# Patient Record
Sex: Male | Born: 1990 | Race: White | Hispanic: No | Marital: Single | Smoking: Never smoker
Health system: Southern US, Community
[De-identification: ages and names within clinical notes are randomized; demographics above are authoritative.]

## PROBLEM LIST (undated history)

## (undated) DIAGNOSIS — R011 Cardiac murmur, unspecified: Secondary | ICD-10-CM

## (undated) DIAGNOSIS — M6282 Rhabdomyolysis: Secondary | ICD-10-CM

## (undated) HISTORY — PX: OTHER SURGICAL HISTORY: SHX169

---

## 2014-09-12 ENCOUNTER — Emergency Department (HOSPITAL_COMMUNITY): Payer: Worker's Compensation

## 2014-09-12 ENCOUNTER — Emergency Department (HOSPITAL_COMMUNITY)
Admission: EM | Admit: 2014-09-12 | Discharge: 2014-09-12 | Disposition: A | Discharge status: Home / Self Care | Payer: Worker's Compensation | Attending: Emergency Medicine | Admitting: Emergency Medicine

## 2014-09-12 ENCOUNTER — Encounter (HOSPITAL_COMMUNITY): Payer: Self-pay | Admitting: Emergency Medicine

## 2014-09-12 DIAGNOSIS — Y9389 Activity, other specified: Secondary | ICD-10-CM | POA: Insufficient documentation

## 2014-09-12 DIAGNOSIS — S80212A Abrasion, left knee, initial encounter: Secondary | ICD-10-CM | POA: Diagnosis not present

## 2014-09-12 DIAGNOSIS — S8392XA Sprain of unspecified site of left knee, initial encounter: Secondary | ICD-10-CM

## 2014-09-12 DIAGNOSIS — Y9289 Other specified places as the place of occurrence of the external cause: Secondary | ICD-10-CM | POA: Insufficient documentation

## 2014-09-12 DIAGNOSIS — W01198A Fall on same level from slipping, tripping and stumbling with subsequent striking against other object, initial encounter: Secondary | ICD-10-CM | POA: Diagnosis not present

## 2014-09-12 DIAGNOSIS — Y998 Other external cause status: Secondary | ICD-10-CM | POA: Diagnosis not present

## 2014-09-12 DIAGNOSIS — S8992XA Unspecified injury of left lower leg, initial encounter: Secondary | ICD-10-CM | POA: Diagnosis present

## 2014-09-12 MED ORDER — NAPROXEN 500 MG PO TABS: 500.0000 mg | ORAL_TABLET | Freq: Two times a day (BID) | ORAL | Status: DC
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
## 2014-09-12 NOTE — Discharge Instructions (Signed)
Knee Pain Knee pain can be a result of an injury or other medical conditions. Treatment will depend on the cause of your pain. HOME CARE  Only take medicine as told by your doctor.  Keep a healthy weight. Being overweight can make the knee hurt more.  Stretch before exercising or playing sports.  If there is constant knee pain, change the way you exercise. Ask your doctor for advice.  Make sure shoes fit well. Choose the right shoe for the sport or activity.  Protect your knees. Wear kneepads if needed.  Rest when you are tired. GET HELP RIGHT AWAY IF:   Your knee pain does not stop.  Your knee pain does not get better.  Your knee joint feels hot to the touch.  You have a fever. MAKE SURE YOU:   Understand these instructions.  Will watch this condition.  Will get help right away if you are not doing well or get worse. Document Released: 03/31/2008 Document Revised: 03/27/2011 Document Reviewed: 03/31/2008 [facility] Patient Information 2015 [place], [place]. This information is not intended to replace advice given to you by your health care provider. Make sure you discuss any questions you have with your health care provider.
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
## 2015-12-04 ENCOUNTER — Emergency Department (HOSPITAL_COMMUNITY)
Admission: EM | Admit: 2015-12-04 | Discharge: 2015-12-05 | Disposition: A | Discharge status: Home / Self Care | Payer: BLUE CROSS/BLUE SHIELD | Attending: Emergency Medicine | Admitting: Emergency Medicine

## 2015-12-04 ENCOUNTER — Encounter (HOSPITAL_COMMUNITY): Payer: Self-pay | Admitting: *Deleted

## 2015-12-04 DIAGNOSIS — L509 Urticaria, unspecified: Secondary | ICD-10-CM | POA: Diagnosis not present

## 2015-12-04 DIAGNOSIS — Z792 Long term (current) use of antibiotics: Secondary | ICD-10-CM | POA: Insufficient documentation

## 2015-12-04 DIAGNOSIS — R21 Rash and other nonspecific skin eruption: Secondary | ICD-10-CM | POA: Diagnosis present

## 2015-12-04 DIAGNOSIS — F17290 Nicotine dependence, other tobacco product, uncomplicated: Secondary | ICD-10-CM | POA: Insufficient documentation

## 2015-12-04 HISTORY — DX: Cardiac murmur, unspecified: R01.1

## 2015-12-04 NOTE — ED Provider Notes (Signed)
[facility] DEPT Provider Note   CSN: [id number] Arrival date & time: 12/04/15  2338   By signing my name below, I, Delton Prairie, attest that this documentation has been prepared under the direction and in the presence of Merryl Hacker, MD  Electronically Signed: Delton [place], ED Scribe. 12/04/15. 12:27 AM.   History   Chief Complaint Chief Complaint  Patient presents with  . Allergic Reaction    The history is provided by the patient. No language interpreter was used.   HPI Comments:  Derrick Wang is a 25 y.o. male who presents to the Emergency Department complaining of a sudden onset rash to his bilateral upper extremities with associated itching which began a couple of hours ago.Marland Kitchen He states he ate a new restuarqant tonight which he has never eaten at before. Pt denies any new changes in medications or changes is soaps/detergents. He states he is taking bactrim (chronically) and recently switched to a new pharmacy. Pt denies SOB, abdominal pain and any other associated symptoms or modifying factors at this time. He is allergic to pollen and poison oak but denies any exposure.    Past Medical History:  Diagnosis Date  . Heart murmur     There are no active problems to display for this patient.   Past Surgical History:  Procedure Laterality Date  . left arm surgery       Home Medications    Prior to Admission medications   Medication Sig Start Date End Date Taking? Authorizing Provider  sulfamethoxazole-trimethoprim (BACTRIM DS,SEPTRA DS) 800-160 MG tablet Take 1 tablet by mouth 2 (two) times daily.   Yes Historical Provider, MD  diphenhydrAMINE (BENADRYL) 25 mg capsule Take 1 capsule (25 mg total) by mouth every 6 (six) hours as needed. 12/05/15   Merryl Hacker, MD  EPINEPHrine 0.3 mg/0.3 mL IJ SOAJ injection Inject 0.3 mLs (0.3 mg total) into the muscle once as needed (anaphylaxis). 12/05/15   Merryl Hacker, MD  famotidine (PEPCID) 20 MG tablet Take 1  tablet (20 mg total) by mouth daily. 12/05/15   Merryl Hacker, MD  naproxen (NAPROSYN) 500 MG tablet Take 1 tablet (500 mg total) by mouth 2 (two) times daily with a meal. 09/12/14   Tammy Triplett, PA-C  predniSONE (DELTASONE) 20 MG tablet Take 2 tablets (40 mg total) by mouth daily. 12/05/15   Merryl Hacker, MD    Family History No family history on file.  Social History Social History  Substance Use Topics  . Smoking status: Never Smoker  . Smokeless tobacco: Current User    Types: Snuff  . Alcohol use 1.2 oz/week    2 Cans of beer per week     Allergies   Patient has no known allergies.   Review of Systems Review of Systems  Constitutional: Negative for fever.  Respiratory: Negative for shortness of breath.   Gastrointestinal: Negative for abdominal pain, nausea and vomiting.  Skin: Positive for rash.  Neurological: Negative for dizziness.  All other systems reviewed and are negative.    Physical Exam Updated Vital Signs BP 135/91   Pulse 69   Temp 97.9 F (36.6 C) (Oral)   Resp 20   Ht 5\' 8"  (1.727 m)   Wt 210 lb (95.3 kg)   SpO2 96%   BMI 31.93 kg/m   Physical Exam  Constitutional: He is oriented to person, place, and time. He appears well-developed and well-nourished. No distress.  HENT:  Head: Normocephalic and atraumatic.  Mouth/Throat:  Oropharynx is clear and moist.  No swelling, uvula midline  Cardiovascular: Normal rate, regular rhythm and normal heart sounds.   No murmur heard. Pulmonary/Chest: Effort normal and breath sounds normal. No respiratory distress. He has no wheezes.  Abdominal: Soft. There is no tenderness.  Musculoskeletal: He exhibits no edema.  Neurological: He is alert and oriented to person, place, and time.  Skin: Skin is warm and dry.  Psychiatric: He has a normal mood and affect.  Hives noted mostly over the bilateral upper extremities  Nursing note and vitals reviewed.    ED Treatments / Results  DIAGNOSTIC  STUDIES:  Oxygen Saturation is 100% on RA, normal by my interpretation.    COORDINATION OF CARE:  12:18 AM Discussed treatment plan with pt at bedside and pt agreed to plan.  Labs (all labs ordered are listed, but only abnormal results are displayed) Labs Reviewed - No data to display  EKG  EKG Interpretation None       Radiology No results found.  Procedures Procedures (including critical care time)  Medications Ordered in ED Medications  diphenhydrAMINE (BENADRYL) injection 25 mg (25 mg Intravenous Given 12/05/15 0058)  famotidine (PEPCID) IVPB 20 mg premix (20 mg Intravenous New Bag/Given 12/05/15 0059)  methylPREDNISolone sodium succinate (SOLU-MEDROL) 125 mg/2 mL injection 125 mg (125 mg Intravenous Given 12/05/15 0100)     Initial Impression / Assessment and Plan / ED Course  I have reviewed the triage vital signs and the nursing notes.  Pertinent labs & imaging results that were available during my care of the patient were reviewed by me and considered in my medical decision making (see chart for details).  Clinical Course     Patient presents with hives bilateral upper extremities. No signs or symptoms of anaphylaxis. Unknown antigen. Patient is on chronic Bactrim. Patient given steroids, Benadryl, Pepcid.  1:41 AM Patient resting comfortably. Hives resolve. Discussed with patient continuing steroids, Pepcid, and Benadryl when necessary. Will sent home with an EpiPen as well.  After history, exam, and medical workup I feel the patient has been appropriately medically screened and is safe for discharge home. Pertinent diagnoses were discussed with the patient. Patient was given return precautions.   Final Clinical Impressions(s) / ED Diagnoses   Final diagnoses:  Hives    New Prescriptions New Prescriptions   DIPHENHYDRAMINE (BENADRYL) 25 MG CAPSULE    Take 1 capsule (25 mg total) by mouth every 6 (six) hours as needed.   EPINEPHRINE 0.3 MG/0.3 ML IJ  SOAJ INJECTION    Inject 0.3 mLs (0.3 mg total) into the muscle once as needed (anaphylaxis).   FAMOTIDINE (PEPCID) 20 MG TABLET    Take 1 tablet (20 mg total) by mouth daily.   PREDNISONE (DELTASONE) 20 MG TABLET    Take 2 tablets (40 mg total) by mouth daily.   I personally performed the services described in this documentation, which was scribed in my presence. The recorded information has been reviewed and is accurate.     Merryl Hacker, MD 12/05/15 [phone]

## 2015-12-04 NOTE — ED Triage Notes (Addendum)
Pt c/o sob, hives, itching that started about a hour ago, pt states the he takes bactrim but has been on it for "awhile" did have to switch to a new pharmacy a few days ago,

## 2015-12-05 DIAGNOSIS — L509 Urticaria, unspecified: Secondary | ICD-10-CM | POA: Diagnosis not present

## 2015-12-05 MED ORDER — DIPHENHYDRAMINE HCL 50 MG/ML IJ SOLN
25.0000 mg | Freq: Once | INTRAMUSCULAR | Status: AC
  Administered 2015-12-05: 25 mg via INTRAVENOUS

## 2015-12-05 MED ORDER — EPINEPHRINE 0.3 MG/0.3ML IJ SOAJ: 0.3000 mg | Freq: Once | INTRAMUSCULAR | 0 refills | Status: DC | PRN

## 2015-12-05 MED ORDER — METHYLPREDNISOLONE SODIUM SUCC 125 MG IJ SOLR
125.0000 mg | Freq: Once | INTRAMUSCULAR | Status: AC
  Administered 2015-12-05: 125 mg via INTRAVENOUS

## 2015-12-05 MED ORDER — METHYLPREDNISOLONE SODIUM SUCC 125 MG IJ SOLR
INTRAMUSCULAR | Status: AC
  Filled 2015-12-05: qty 2

## 2015-12-05 MED ORDER — DIPHENHYDRAMINE HCL 50 MG/ML IJ SOLN
INTRAMUSCULAR | Status: AC
  Filled 2015-12-05: qty 1

## 2015-12-05 MED ORDER — FAMOTIDINE IN NACL 20-0.9 MG/50ML-% IV SOLN
20.0000 mg | Freq: Once | INTRAVENOUS | Status: AC
  Administered 2015-12-05: 20 mg via INTRAVENOUS

## 2015-12-05 MED ORDER — DIPHENHYDRAMINE HCL 25 MG PO CAPS: 25.0000 mg | ORAL_CAPSULE | Freq: Four times a day (QID) | ORAL | 0 refills | Status: DC | PRN

## 2015-12-05 MED ORDER — PREDNISONE 20 MG PO TABS: 40.0000 mg | ORAL_TABLET | Freq: Every day | ORAL | 0 refills | Status: DC

## 2015-12-05 MED ORDER — FAMOTIDINE IN NACL 20-0.9 MG/50ML-% IV SOLN
INTRAVENOUS | Status: AC
  Filled 2015-12-05: qty 50

## 2015-12-05 MED ORDER — FAMOTIDINE 20 MG PO TABS: 20.0000 mg | ORAL_TABLET | Freq: Every day | ORAL | 0 refills | Status: DC

## 2019-09-10 ENCOUNTER — Other Ambulatory Visit: Payer: Self-pay

## 2019-09-10 ENCOUNTER — Emergency Department (HOSPITAL_COMMUNITY): Payer: BC Managed Care – PPO

## 2019-09-10 ENCOUNTER — Emergency Department (HOSPITAL_COMMUNITY)
Admission: EM | Admit: 2019-09-10 | Discharge: 2019-09-10 | Disposition: A | Discharge status: Home / Self Care | Payer: BC Managed Care – PPO | Attending: Emergency Medicine | Admitting: Emergency Medicine

## 2019-09-10 ENCOUNTER — Encounter (HOSPITAL_COMMUNITY): Payer: Self-pay | Admitting: *Deleted

## 2019-09-10 DIAGNOSIS — R1032 Left lower quadrant pain: Secondary | ICD-10-CM | POA: Diagnosis present

## 2019-09-10 DIAGNOSIS — R197 Diarrhea, unspecified: Secondary | ICD-10-CM

## 2019-09-10 DIAGNOSIS — K529 Noninfective gastroenteritis and colitis, unspecified: Secondary | ICD-10-CM | POA: Insufficient documentation

## 2019-09-10 DIAGNOSIS — K921 Melena: Secondary | ICD-10-CM | POA: Insufficient documentation

## 2019-09-10 DIAGNOSIS — Z79899 Other long term (current) drug therapy: Secondary | ICD-10-CM | POA: Diagnosis not present

## 2019-09-10 HISTORY — DX: Rhabdomyolysis: M62.82

## 2019-09-10 LAB — CBC
HCT: 46.9 % (ref 39.0–52.0)
Hemoglobin: 15.6 g/dL (ref 13.0–17.0)
MCH: 29.9 pg (ref 26.0–34.0)
MCHC: 33.3 g/dL (ref 30.0–36.0)
MCV: 90 fL (ref 80.0–100.0)
Platelets: 304 10*3/uL (ref 150–400)
RBC: 5.21 MIL/uL (ref 4.22–5.81)
RDW: 12.3 % (ref 11.5–15.5)
WBC: 8.3 10*3/uL (ref 4.0–10.5)
nRBC: 0 % (ref 0.0–0.2)

## 2019-09-10 LAB — POC OCCULT BLOOD, ED: Fecal Occult Bld: NEGATIVE

## 2019-09-10 LAB — COMPREHENSIVE METABOLIC PANEL
ALT: 25 U/L (ref 0–44)
AST: 25 U/L (ref 15–41)
Albumin: 4.4 g/dL (ref 3.5–5.0)
Alkaline Phosphatase: 104 U/L (ref 38–126)
Anion gap: 10 (ref 5–15)
BUN: 15 mg/dL (ref 6–20)
CO2: 23 mmol/L (ref 22–32)
Calcium: 8.9 mg/dL (ref 8.9–10.3)
Chloride: 103 mmol/L (ref 98–111)
Creatinine, Ser: 1.11 mg/dL (ref 0.61–1.24)
GFR calc Af Amer: >60 mL/min (ref >60)
GFR calc non Af Amer: >60 mL/min (ref >60)
Glucose, Bld: 89 mg/dL (ref 70–99)
Potassium: 4 mmol/L (ref 3.5–5.1)
Sodium: 136 mmol/L (ref 135–145)
Total Bilirubin: 0.6 mg/dL (ref 0.3–1.2)
Total Protein: 8.3 g/dL — ABNORMAL HIGH (ref 6.5–8.1)

## 2019-09-10 LAB — TYPE AND SCREEN
ABO/RH(D): O POS
Antibody Screen: NEGATIVE

## 2019-09-10 MED ORDER — AZITHROMYCIN 500 MG PO TABS
500.0000 mg | ORAL_TABLET | Freq: Every day | ORAL | 0 refills | Status: AC
Start: 2019-09-10 — End: 2019-09-15

## 2019-09-10 MED ORDER — IOHEXOL 300 MG/ML  SOLN
100.0000 mL | Freq: Once | INTRAMUSCULAR | Status: AC | PRN
  Administered 2019-09-10: 100 mL via INTRAVENOUS

## 2019-09-10 NOTE — ED Triage Notes (Signed)
Pt states he started yesterday with abdominal pain and some diarrhea and it has progressively went from dark stools, coffee ground consistency stools to bright red blood

## 2019-09-10 NOTE — ED Notes (Signed)
Pt has commode at bedside.

## 2019-09-10 NOTE — Discharge Instructions (Addendum)
Your work-up today was reassuring, I want you to stay hydrated.  Use the attached instructions.  Stick to a brat diet for little while, this includes bananas, rice, applesauce and toast.  Want you to also take antibiotics as directed.  I want you to follow-up with your primary care in the next couple of days.  In regards to antibiotics, they do have a lot of side effects including diarrhea as we spoke about.  Please speak to your pharmacist about all the side effects.

## 2019-09-10 NOTE — ED Provider Notes (Signed)
[facility] Provider Note   CSN: [id number] Arrival date & time: 09/10/19  1013     History Chief Complaint  Patient presents with  . Rectal Bleeding    Derrick Wang is a 29 y.o. male with reported past medical history represents the emergency department today for abdominal pain and diarrhea.  Patient states that abdominal pain is more in his left lower quadrant, started a couple days ago and noticed that he started having some dark brown to black stool for the past 2 days, this morning he woke up and noticed that he had bright red blood per rectum in the toilet bowl.  States that he only had one episode of this, after he went to the bathroom again this did not happen again.  States that he is about about 16 episodes of diarrhea since yesterday.  Abdominal pain has persisted, is intermittent.  States that it is a dull aching pain.  Denies any recent antibiotics.  Denies any recent travel.  Denies any recent hiking or drinking water from streams.  Has never had anything happen to him like this before.  Denies any fevers, chills, nausea, vomiting.  Denies any rashes, headache, myalgias.  Has an appetite.  No abdominal surgeries.  States that he went out drinking 2 nights ago, no drug use.  No rectal pain, no pain when he uses the restroom.  No history of hemorrhoids.  HPI     Past Medical History:  Diagnosis Date  . Heart murmur   . Rhabdomyolysis     There are no problems to display for this patient.   Past Surgical History:  Procedure Laterality Date  . left arm surgery         History reviewed. No pertinent family history.  Social History   Tobacco Use  . Smoking status: Never Smoker  . Smokeless tobacco: Current User    Types: Snuff  Substance Use Topics  . Alcohol use: Yes    Alcohol/week: 2.0 standard drinks    Types: 2 Cans of beer per week  . Drug use: No    Home Medications Prior to Admission medications   Medication Sig Start Date End  Date Taking? Authorizing Provider  azithromycin (ZITHROMAX) 500 MG tablet Take 1 tablet (500 mg total) by mouth daily for 5 days. Take 1 tablet a day for 5 days. 09/10/19 09/15/19  Alfredia Client, PA-C  diphenhydrAMINE (BENADRYL) 25 mg capsule Take 1 capsule (25 mg total) by mouth every 6 (six) hours as needed. 12/05/15   Horton, Barbette Hair, MD  EPINEPHrine 0.3 mg/0.3 mL IJ SOAJ injection Inject 0.3 mLs (0.3 mg total) into the muscle once as needed (anaphylaxis). 12/05/15   Horton, Barbette Hair, MD  famotidine (PEPCID) 20 MG tablet Take 1 tablet (20 mg total) by mouth daily. 12/05/15   Horton, Barbette Hair, MD  naproxen (NAPROSYN) 500 MG tablet Take 1 tablet (500 mg total) by mouth 2 (two) times daily with a meal. 09/12/14   Triplett, Tammy, PA-C  predniSONE (DELTASONE) 20 MG tablet Take 2 tablets (40 mg total) by mouth daily. 12/05/15   Horton, Barbette Hair, MD  sulfamethoxazole-trimethoprim (BACTRIM DS,SEPTRA DS) 800-160 MG tablet Take 1 tablet by mouth 2 (two) times daily.    [provider]    Allergies    Patient has no known allergies.  Review of Systems   Review of Systems  Constitutional: Negative for chills, diaphoresis, fatigue and fever.  HENT: Negative for congestion, sore throat and trouble  swallowing.   Eyes: Negative for pain and visual disturbance.  Respiratory: Negative for cough, shortness of breath and wheezing.   Cardiovascular: Negative for chest pain, palpitations and leg swelling.  Gastrointestinal: Positive for abdominal pain, blood in stool and diarrhea. Negative for abdominal distention, nausea and vomiting.  Genitourinary: Negative for difficulty urinating.  Musculoskeletal: Negative for back pain, neck pain and neck stiffness.  Skin: Negative for pallor.  Neurological: Negative for dizziness, speech difficulty, weakness and headaches.  Psychiatric/Behavioral: Negative for confusion.    Physical Exam Updated Vital Signs BP (!) 133/94   Pulse 72   Temp 98.2 F  (36.8 C) (Oral)   Resp (!) 22   Ht 5\' 8"  (1.727 m)   Wt 111.6 kg   SpO2 100%   BMI 37.40 kg/m   Physical Exam Exam conducted with a chaperone present.  Constitutional:      General: He is not in acute distress.    Appearance: Normal appearance. He is not ill-appearing, toxic-appearing or diaphoretic.  HENT:     Head: Normocephalic and atraumatic.     Mouth/Throat:     Mouth: Mucous membranes are moist.     Pharynx: Oropharynx is clear.  Eyes:     General: No scleral icterus.    Extraocular Movements: Extraocular movements intact.     Pupils: Pupils are equal, round, and reactive to light.  Cardiovascular:     Rate and Rhythm: Normal rate and regular rhythm.     Pulses: Normal pulses.     Heart sounds: Normal heart sounds.  Pulmonary:     Effort: Pulmonary effort is normal. No respiratory distress.     Breath sounds: Normal breath sounds. No stridor. No wheezing, rhonchi or rales.  Chest:     Chest wall: No tenderness.  Abdominal:     General: Abdomen is flat. Bowel sounds are normal. There is no distension.     Palpations: Abdomen is soft.     Tenderness: There is abdominal tenderness in the left lower quadrant. There is no guarding or rebound. Negative signs include Murphy's sign, Rovsing's sign, McBurney's sign and psoas sign.  Genitourinary:    Comments: Chaperone present. Digital Rectal exam reveals sphincter with good tone. No external hemorrhoids, masses, or fissures. Stool color is brown with no overt blood. No gross melena.   Musculoskeletal:        General: No swelling or tenderness. Normal range of motion.     Cervical back: Normal range of motion and neck supple. No rigidity.     Right lower leg: No edema.     Left lower leg: No edema.  Skin:    General: Skin is warm and dry.     Capillary Refill: Capillary refill takes less than 2 seconds.     Coloration: Skin is not pale.  Neurological:     General: No focal deficit present.     Mental Status: He is  alert and oriented to person, place, and time.  Psychiatric:        Mood and Affect: Mood normal.        Behavior: Behavior normal.     ED Results / Procedures / Treatments   Labs (all labs ordered are listed, but only abnormal results are displayed) Labs Reviewed  COMPREHENSIVE METABOLIC PANEL - Abnormal; Notable for the following components:      Result Value   Total Protein 8.3 (*)    All other components within normal limits  GASTROINTESTINAL PANEL BY PCR, STOOL (REPLACES  STOOL CULTURE)  C DIFFICILE QUICK SCREEN W PCR REFLEX  CBC  POC OCCULT BLOOD, ED  TYPE AND SCREEN    EKG None  Radiology CT Abdomen Pelvis W Contrast  Result Date: 09/10/2019 CLINICAL DATA:  LEFT lower quadrant abdominal pain EXAM: CT ABDOMEN AND PELVIS WITH CONTRAST TECHNIQUE: Multidetector CT imaging of the abdomen and pelvis was performed using the standard protocol following bolus administration of intravenous contrast. CONTRAST:  14mL OMNIPAQUE IOHEXOL 300 MG/ML  SOLN COMPARISON:  None FINDINGS: Lower chest: Lung bases are clear. Hepatobiliary: No focal, suspicious hepatic lesion. No pericholecystic stranding. No biliary duct distension. Portal vein is patent. Pancreas: Pancreas is normal. Spleen: Spleen normal in size and contour without focal lesion. Adrenals/Urinary Tract: Adrenal glands are normal. No hydronephrosis.  No suspicious renal lesion. Stomach/Bowel: Stomach under distended.  No small bowel dilation. Appendix is normal. Mid transverse colon with thickening. Generalized increased vascularity about the colon. Ascending colon to a lesser extent with some thickening. Appendix is normal. Mild prominence of ileocolic lymph nodes. Vascular/Lymphatic: No atheromatous plaque of the abdominal aorta. No aneurysmal dilation. SMV is patent. No adenopathy in the retroperitoneum. No pelvic lymphadenopathy. Reproductive: Prostate unremarkable by CT. Other: Small fat containing umbilical hernia. Musculoskeletal:  No acute musculoskeletal process. No destructive bone finding. IMPRESSION: 1. Generalized increased vascularity about the colon. Findings suggest generalized mild colitis perhaps worse in the ascending and proximal transverse colon. 2. Mild prominence of ileocolic lymph nodes, likely reactive. 3. Small fat containing umbilical hernia. 4. Aortic atherosclerosis. Aortic Atherosclerosis (ICD10-I70.0). Electronically Signed   By: Zetta Bills M.D.   On: 09/10/2019 17:19    Procedures Procedures (including critical care time)  Medications Ordered in ED Medications  iohexol (OMNIPAQUE) 300 MG/ML solution 100 mL (100 mLs Intravenous Contrast Given 09/10/19 1632)    ED Course  I have reviewed the triage vital signs and the nursing notes.  Pertinent labs & imaging results that were available during my care of the patient were reviewed by me and considered in my medical decision making (see chart for details).    MDM Rules/Calculators/A&P                          Adreyan Carbajal is a 29 y.o. male with reported past medical history represents the emergency department today for abdominal pain and diarrhea.  Patient with left lower quadrant pain with diarrhea and blood in stool, concern for diverticulitis at this time will obtain CT scan.  We will also obtain stool cultures, no history of recent travel or recent antibiotics.  Patient states that pain is controllable, does not want anything for pain.  No nausea or vomiting.  Vital stable.  Hemoccult negative.  CT findings suggestive of colitis.  I think patient most likely has dysentery from food poisoning.  CBC and CMP stable.  No signs of anemia.  Did discuss GI panel PCR testing, patient was unable to provide this at this time.  Will treat with azithromycin at this time.  Patient remains to be not nauseous, no peritoneal signs on exam.  Patient to be discharged.  Did discuss strict return precautions.  Also discussed patient needs to stay hydrated and brat  diet.  Patient agreeable.  Patient to follow-up with primary care in the next couple days.  Doubt need for further emergent work up at this time. I explained the diagnosis and have given explicit precautions to return to the ER including for any other new or  worsening symptoms. The patient understands and accepts the medical plan as it's been dictated and I have answered their questions. Discharge instructions concerning home care and prescriptions have been given. The patient is STABLE and is discharged to home in good condition.  Final Clinical Impression(s) / ED Diagnoses Final diagnoses:  Diarrhea of presumed infectious origin  Colitis    Rx / DC Orders ED Discharge Orders         Ordered    azithromycin (ZITHROMAX) 500 MG tablet  Daily        09/10/19 1733           Alfredia Client, PA-C 09/10/19 1744    Elnora Morrison, MD 09/12/19 1501

## 2020-04-23 LAB — IRON,TIBC AND FERRITIN PANEL
%SAT: 34
Ferritin: 86
Iron: 116
TIBC: 339

## 2020-04-23 LAB — CBC AND DIFFERENTIAL
HCT: 48 (ref 41–53)
Hemoglobin: 15.8 (ref 13.5–17.5)
Platelets: 321 (ref 150–399)
WBC: 6.6

## 2020-04-23 LAB — CBC: RBC: 5.38 — AB (ref 3.87–5.11)

## 2020-05-03 ENCOUNTER — Ambulatory Visit: Payer: BC Managed Care – PPO | Admitting: Nurse Practitioner

## 2020-05-03 ENCOUNTER — Encounter: Payer: Self-pay | Admitting: Nurse Practitioner

## 2020-05-03 ENCOUNTER — Other Ambulatory Visit (INDEPENDENT_AMBULATORY_CARE_PROVIDER_SITE_OTHER): Payer: BC Managed Care – PPO

## 2020-05-03 VITALS — BP 160/100 | HR 88 | Ht 68.0 in | Wt 253.0 lb

## 2020-05-03 DIAGNOSIS — K921 Melena: Secondary | ICD-10-CM

## 2020-05-03 DIAGNOSIS — R197 Diarrhea, unspecified: Secondary | ICD-10-CM

## 2020-05-03 DIAGNOSIS — R1030 Lower abdominal pain, unspecified: Secondary | ICD-10-CM

## 2020-05-03 DIAGNOSIS — K625 Hemorrhage of anus and rectum: Secondary | ICD-10-CM | POA: Diagnosis not present

## 2020-05-03 LAB — CBC WITH DIFFERENTIAL/PLATELET
Basophils Absolute: 0 10*3/uL (ref 0.0–0.1)
Basophils Relative: 0.5 % (ref 0.0–3.0)
Eosinophils Absolute: 0.1 10*3/uL (ref 0.0–0.7)
Eosinophils Relative: 1.6 % (ref 0.0–5.0)
HCT: 46.5 % (ref 39.0–52.0)
Hemoglobin: 15.9 g/dL (ref 13.0–17.0)
Lymphocytes Relative: 26.8 % (ref 12.0–46.0)
Lymphs Abs: 2.4 10*3/uL (ref 0.7–4.0)
MCHC: 34.1 g/dL (ref 30.0–36.0)
MCV: 87.5 fl (ref 78.0–100.0)
Monocytes Absolute: 0.6 10*3/uL (ref 0.1–1.0)
Monocytes Relative: 6.5 % (ref 3.0–12.0)
Neutro Abs: 5.9 10*3/uL (ref 1.4–7.7)
Neutrophils Relative %: 64.6 % (ref 43.0–77.0)
Platelets: 309 10*3/uL (ref 150.0–400.0)
RBC: 5.32 Mil/uL (ref 4.22–5.81)
RDW: 12.6 % (ref 11.5–15.5)
WBC: 9.1 10*3/uL (ref 4.0–10.5)

## 2020-05-03 LAB — COMPREHENSIVE METABOLIC PANEL
ALT: 23 U/L (ref 0–53)
AST: 20 U/L (ref 0–37)
Albumin: 4.3 g/dL (ref 3.5–5.2)
Alkaline Phosphatase: 106 U/L (ref 39–117)
BUN: 18 mg/dL (ref 6–23)
CO2: 28 mEq/L (ref 19–32)
Calcium: 9.9 mg/dL (ref 8.4–10.5)
Chloride: 103 mEq/L (ref 96–112)
Creatinine, Ser: 1.06 mg/dL (ref 0.40–1.50)
GFR: 94.77 mL/min (ref >60.00)
Glucose, Bld: 88 mg/dL (ref 70–99)
Potassium: 4.3 mEq/L (ref 3.5–5.1)
Sodium: 138 mEq/L (ref 135–145)
Total Bilirubin: 0.3 mg/dL (ref 0.2–1.2)
Total Protein: 7.7 g/dL (ref 6.0–8.3)

## 2020-05-03 LAB — C-REACTIVE PROTEIN: CRP: <1 mg/dL (ref 0.5–20.0)

## 2020-05-03 MED ORDER — DICYCLOMINE HCL 10 MG PO CAPS: 10.0000 mg | ORAL_CAPSULE | Freq: Two times a day (BID) | ORAL | 0 refills | Status: DC | PRN

## 2020-05-03 MED ORDER — AMBULATORY NON FORMULARY MEDICATION: 1 refills | Status: DC

## 2020-05-03 NOTE — Progress Notes (Addendum)
05/03/2020 Thelbert Gartin [id number] [birth year]   CHIEF COMPLAINT: Rectal bleeding   HISTORY OF PRESENT ILLNESS: Derrick Wang is a 30 year old male with a past medical history of transposition of the great vessels and mild rhabdomyolysis in 2015 and colitis 08/2019.  Left arm fracture s/p several surgeries in 2010. He presents to our office today by Dr. Abran Richard for further evaluation regarding lower abdominal pain, diarrhea and rectal bleeding.  He is quite anxious and he is  concerned regarding his diarrhea and rectal bleeding. He describes passing blood like to watery diarrhea bowel movements which occurs 10 to 15 minutes after eating most foods which has progressively worsened over the past 6 months.  He intermittently passes bright red blood per the rectum and less frequently passes a black loose to semiformed stool.  He takes Pepto-Bismol as needed which can result in black stools. However, there are times when he passes a black stool without recent Pepto bismol use.  He last past bright red blood per the rectum on Wed 04/28/2020 with associated lower abdominal cramping. Thursday 04/29/2020 he passed a "super dark coffee ground like stool" with a moderate amount of bright red blood in the toilet water with anal burning discomfort. He also describes seeing a small amount of red blood on the toilet tissue twice weekly.  He has intermittent lower abdominal pain. He was diagnosed with colitis 08/2019.  At that time, he developed lower abdominal pain, diarrhea and rectal bleeding with reports of dark brown and black stools so he presented to [facility] ED 09/10/2019 for further evaluation.  An abdominal/pelvic CT scan identified mid transverse colon wall thickening with generalized increased vascularity to the colon suggestive of colitis which was more prominent in the ascending and proximal transverse colon.  He was treated with Azithromycin for possible infectious colitis.  No further  antibiotics since that time. No family history of IBD or colorectal cancer. He takes goody powder once every few months. No other NSAIDS. No dysphagia or heartburn. He had a little nausea x 1 day last week without recurrence. No vomiting. No fever, sweats or chills. No weight loss.  He has a history of transposition of the great vessels previously followed by cardiology at [facility].  He denies having a recent echo or cardiac evaluation within the past 3 years.  He denies having chest pain, palpitations or shortness of breath.  CTAP 09/10/2019: Mid transverse colon with thickening. Generalized increased vascularity about the colon. Ascending colon to a lesser extent with some thickening. Appendix is normal. Mild prominence of ileocolic lymph nodes. 1. Generalized increased vascularity about the colon. Findings suggest generalized mild colitis perhaps worse in the ascending and proximal transverse colon. 2. Mild prominence of ileocolic lymph nodes, likely reactive. 3. Small fat containing umbilical hernia. 4. 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 Atrial situs solitus. L  Ventricular Loop.  Normal position great vessels. Left ventricular systolic function is  normal. which is morpholgic RV  The right ventricle is moderately dilated.  The right ventricular systolic function is mildly reduced.which is working  as systemic ventricle  There is moderate tricuspid regurgitation.  There is no pericardial effusion.    CBC Latest Ref Rng & Units 09/10/2019  WBC 4.0 - 10.5 K/uL 8.3  Hemoglobin 13.0 - 17.0 g/dL 15.6  Hematocrit 39.0 - 52.0 % 46.9  Platelets 150 - 400  K/uL 304    CMP Latest Ref Rng & Units 09/10/2019  Glucose 70 - 99 mg/dL 89  BUN 6 - 20 mg/dL 15  Creatinine 0.61 - 1.24 mg/dL 1.11  Sodium 135 - 145 mmol/L 136  Potassium 3.5 - 5.1 mmol/L 4.0   Chloride 98 - 111 mmol/L 103  CO2 22 - 32 mmol/L 23  Calcium 8.9 - 10.3 mg/dL 8.9  Total Protein 6.5 - 8.1 g/dL 8.3(H)  Total Bilirubin 0.3 - 1.2 mg/dL 0.6  Alkaline Phos 38 - 126 U/L 104  AST 15 - 41 U/L 25  ALT 0 - 44 U/L 25     Past Medical History:  Diagnosis Date  . Heart murmur   . Rhabdomyolysis    Past Surgical History:  Procedure Laterality Date  . left arm surgery     Social History: He is married. He has a 39 month old son. He is a Engineer, structural. Nonsmoker. He drinks beer 3 to 4 beers weekly. No drug use.   Family History: Mother died age 47 heroin. Father age 64 elevated cholesterol. Brother with drug abuse   No Known Allergies   No outpatient encounter medications on file as of 05/03/2020.   No facility-administered encounter medications on file as of 05/03/2020.    REVIEW OF SYSTEMS: See HPI, all other systems reviewed and are negative  PHYSICAL EXAM: BP (!) 160/100 (BP Location: Left Arm, Patient Position: Sitting)   Pulse 88   Ht 5\' 8"  (1.727 m)   Wt 253 lb (114.8 kg)   SpO2 98%   BMI 38.47 kg/m   General: 30 year old male in no acute distress. Head: Normocephalic and atraumatic. Eyes:  Sclerae non-icteric, conjunctive pink. Ears: Normal auditory acuity. Mouth: Dentition intact. No ulcers or lesions.  Neck: Supple, no lymphadenopathy or thyromegaly.  Lungs: Clear bilaterally to auscultation without wheezes, crackles or rhonchi. Heart: Regular rate and rhythm. No murmur, rub or gallop appreciated.  Abdomen: Soft, nontender, non distended. No masses. No hepatosplenomegaly. Normoactive bowel sounds x 4 quadrants.  Rectal: Posterior fissure without exudate, query developing fistula. Internal hemorrhoids without prolapse. No mass. CMA Melissa present during exam.  Musculoskeletal: Symmetrical with no gross deformities. Skin: Warm and dry. No rash or lesions on visible extremities. Extremities: No edema. Neurological: Alert oriented x 4, no focal  deficits.  Psychological:  Alert and cooperative. Normal mood and affect.  ASSESSMENT AND PLAN:  22. 30 year old male with lower abdominal pain, diarrhea and rectal bleeding. CTAP 08/2019 showed evidence of colitis.  -CBC, CMP, CRP and GI pathogen panel  -Dicyclomine 10mg  one po bid PRN not to take while on work duty as a Engineer, structural  -Diagnostic colonoscopy to rule out IBD, colorectal cancer. Colonoscopy benefits and risks discussed including risk with sedation, risk of bleeding, perforation and infection  -Probiotic of choice once daily  2. Posterior anal fissure -Diltiazem 2%/lidocaine 2% fissure ointment apply a small amount inside the anal opening 3 times daily for 6 weeks  3. Melenic stools +/- Pepto bismol use -EGD benefits and risks discussed including risk with sedation, risk of bleeding, perforation and infection   4.  History of transposition of the great vessels.  Echo 12/26/2014 showed normal LV function.  Right ventricle moderately dilated with reduced RV systolic function.  Moderate tricuspid regurgitation.  -Patient was instructed to schedule a cardiology consult to include an update ECHO prior to proceeding with an EGD and colonoscopy. He currently schedule an EGD/colonoscopy on 05/10/2020, he is  aware his procedure date will be rescheduled to a later date if he is unable to see a cardiologist and complete an ECHO this week.    Further follow-up to be determined after the above evaluation completed     Addendum: Osvaldo Angst CRNA reviewed the patient's chart and GI consult. Patient cleared to proceed with EGD/colonoscopy at [facility] as scheduled.  See message below.  Osvaldo Angst, CRNA  Noralyn Pick, NP Jaclyn Shaggy,  I have reviewed this pt's chart. His last PEDS CARDS note , although from 2016, is very reassuring. This information along with his activity level make him an acceptable pt for [facility] and is cleared for anesthesia care.   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 x 1 on 05/07/2020.  Patient to proceed with EGD and colonoscopy as verified by Dr. Bryan Lemma.    CC:  Joyice Faster, FNP

## 2020-05-03 NOTE — Patient Instructions (Addendum)
If you are age 30 or younger, your body mass index should be between 19-25. Your Body mass index is 38.47 kg/m. If this is out of the aformentioned range listed, please consider follow up with your Primary Care Provider.   PROCEDURES: You have been scheduled for an endoscopy and colonoscopy. Please follow the written instructions given to you at your visit today. If you use inhalers (even only as needed), please bring them with you on the day of your procedure.  LABS:  Lab work has been ordered for you today. Our lab is located in the basement. Press "B" on the elevator. The lab is located at the first door on the left as you exit the elevator.   We have sent a prescription for Diltiazem 2%/Lidocaine 2% gel to [facility] for you. Using your index finger, you should apply a small amount of medication inside the anal opening and to the external anal area twice daily x 4 weeks.  [facility]'s information is below: Address: [address], [place], [place] [address]  [phone]  *Please DO NOT go directly from our office to pick up this medication! Give the pharmacy 1 day to process the prescription as this is compounded and takes time to make.  MEDICATION: We have sent the following medication to your pharmacy for you to pick up at your convenience: Dicyclomine 10 MG, take 1 twice a day as needed, do not take while working.  Please start a Probiotic of your choice daily.

## 2020-05-04 ENCOUNTER — Other Ambulatory Visit: Payer: BC Managed Care – PPO

## 2020-05-04 DIAGNOSIS — K921 Melena: Secondary | ICD-10-CM

## 2020-05-04 DIAGNOSIS — R197 Diarrhea, unspecified: Secondary | ICD-10-CM

## 2020-05-04 DIAGNOSIS — R1030 Lower abdominal pain, unspecified: Secondary | ICD-10-CM

## 2020-05-04 DIAGNOSIS — K625 Hemorrhage of anus and rectum: Secondary | ICD-10-CM

## 2020-05-05 ENCOUNTER — Other Ambulatory Visit (HOSPITAL_COMMUNITY): Payer: Self-pay | Admitting: Internal Medicine

## 2020-05-05 ENCOUNTER — Telehealth: Payer: Self-pay | Admitting: Nurse Practitioner

## 2020-05-05 DIAGNOSIS — Q249 Congenital malformation of heart, unspecified: Secondary | ICD-10-CM

## 2020-05-05 NOTE — Telephone Encounter (Signed)
Spoke with the patient. He is s scheduled for an Echo. He assures me he is seeing the doctor the same day. The ECHO was ordered by the PCP office Dr Abran Richard. I googled her. She is IM, not cardiology. The patient says he is seeing someone with [facility].

## 2020-05-05 NOTE — Telephone Encounter (Signed)
Noted, await ECHO results.

## 2020-05-05 NOTE — Telephone Encounter (Signed)
Derrick Wang, when I spoke to the patient I advised for him to see a cardiologist and to have an ECHO. He has congenital transposition of the great vessels. Refer to his office appointment. He has not seen a cardiologist in more than 3 years and his last ECHO was at least 5 years ago. Pls verify exactly what appointment he has tomorrow? Is it for an ECHO?   Thank you!

## 2020-05-05 NOTE — Telephone Encounter (Signed)
He is scheduled at [facility].

## 2020-05-05 NOTE — Telephone Encounter (Signed)
Patient called to inform that he is able to get an EKG 4/21 so he should be okay to continue procedure Monday 4/25. Best contact number [phone]

## 2020-05-06 ENCOUNTER — Other Ambulatory Visit: Payer: Self-pay

## 2020-05-06 ENCOUNTER — Ambulatory Visit (HOSPITAL_COMMUNITY)
Admission: RE | Admit: 2020-05-06 | Discharge: 2020-05-06 | Disposition: A | Discharge status: Home / Self Care | Payer: BC Managed Care – PPO | Source: Ambulatory Visit | Attending: Internal Medicine | Admitting: Internal Medicine

## 2020-05-06 DIAGNOSIS — Q249 Congenital malformation of heart, unspecified: Secondary | ICD-10-CM | POA: Insufficient documentation

## 2020-05-06 DIAGNOSIS — Q248 Other specified congenital malformations of heart: Secondary | ICD-10-CM

## 2020-05-06 LAB — GI PROFILE, STOOL, PCR

## 2020-05-06 NOTE — Progress Notes (Signed)
*  PRELIMINARY RESULTS* Echocardiogram 2D Echocardiogram has been performed.  Derrick Wang 05/06/2020, 3:15 PM

## 2020-05-07 ENCOUNTER — Other Ambulatory Visit: Payer: Self-pay | Admitting: Nurse Practitioner

## 2020-05-07 MED ORDER — AZITHROMYCIN 500 MG PO TABS: ORAL_TABLET | ORAL | 0 refills | Status: DC

## 2020-05-07 NOTE — Progress Notes (Signed)
Agree with the assessment and plan as outlined by Carl Best, NP. Agree with plan for  serologic w/u, stool studies, and expedited EGD/Colo, provided he can be seen by his Cardiologist in short order for evaluation and cardiac clearance to proceed.    Gerrit Heck, DO, [facility]

## 2020-05-09 LAB — ECHOCARDIOGRAM COMPLETE
AV Mean grad: 8 mmHg
AV Peak grad: 14.4 mmHg
Ao pk vel: 1.9 m/s
Area-P 1/2: 3.45 cm2

## 2020-05-10 ENCOUNTER — Encounter: Payer: Self-pay | Admitting: Gastroenterology

## 2020-05-10 ENCOUNTER — Other Ambulatory Visit: Payer: Self-pay | Admitting: General Surgery

## 2020-05-10 ENCOUNTER — Ambulatory Visit (AMBULATORY_SURGERY_CENTER): Payer: BC Managed Care – PPO | Admitting: Gastroenterology

## 2020-05-10 ENCOUNTER — Telehealth: Payer: Self-pay | Admitting: General Surgery

## 2020-05-10 ENCOUNTER — Other Ambulatory Visit: Payer: Self-pay

## 2020-05-10 VITALS — BP 144/88 | HR 73 | Temp 98.9°F | Resp 13 | Ht 68.0 in | Wt 253.0 lb

## 2020-05-10 DIAGNOSIS — K200 Eosinophilic esophagitis: Secondary | ICD-10-CM

## 2020-05-10 DIAGNOSIS — K449 Diaphragmatic hernia without obstruction or gangrene: Secondary | ICD-10-CM

## 2020-05-10 DIAGNOSIS — R197 Diarrhea, unspecified: Secondary | ICD-10-CM

## 2020-05-10 DIAGNOSIS — K2990 Gastroduodenitis, unspecified, without bleeding: Secondary | ICD-10-CM

## 2020-05-10 DIAGNOSIS — R1030 Lower abdominal pain, unspecified: Secondary | ICD-10-CM

## 2020-05-10 DIAGNOSIS — K2980 Duodenitis without bleeding: Secondary | ICD-10-CM

## 2020-05-10 DIAGNOSIS — K52832 Lymphocytic colitis: Secondary | ICD-10-CM

## 2020-05-10 DIAGNOSIS — K2970 Gastritis, unspecified, without bleeding: Secondary | ICD-10-CM | POA: Diagnosis not present

## 2020-05-10 DIAGNOSIS — K921 Melena: Secondary | ICD-10-CM

## 2020-05-10 DIAGNOSIS — D128 Benign neoplasm of rectum: Secondary | ICD-10-CM

## 2020-05-10 DIAGNOSIS — K641 Second degree hemorrhoids: Secondary | ICD-10-CM | POA: Diagnosis not present

## 2020-05-10 DIAGNOSIS — K625 Hemorrhage of anus and rectum: Secondary | ICD-10-CM

## 2020-05-10 DIAGNOSIS — R194 Change in bowel habit: Secondary | ICD-10-CM

## 2020-05-10 MED ORDER — SODIUM CHLORIDE 0.9 % IV SOLN: 500.0000 mL | Freq: Once | INTRAVENOUS | Status: DC

## 2020-05-10 MED ORDER — OMEPRAZOLE 20 MG PO CPDR: 20.0000 mg | DELAYED_RELEASE_CAPSULE | Freq: Two times a day (BID) | ORAL | 0 refills | Status: DC

## 2020-05-10 MED ORDER — DICYCLOMINE HCL 10 MG PO CAPS: 10.0000 mg | ORAL_CAPSULE | Freq: Three times a day (TID) | ORAL | 3 refills | Status: DC | PRN

## 2020-05-10 MED ORDER — DICYCLOMINE HCL 10 MG PO CAPS: 10.0000 mg | ORAL_CAPSULE | Freq: Three times a day (TID) | ORAL | 0 refills | Status: DC

## 2020-05-10 MED ORDER — DICYCLOMINE HCL 10 MG PO CAPS: ORAL_CAPSULE | ORAL | 0 refills | Status: DC

## 2020-05-10 NOTE — Op Note (Signed)
[facility] Patient Name: Derrick Wang Procedure Date: 05/10/2020 2:39 PM MRN: [id number] Endoscopist: Gerrit Heck , MD Age: 30 Referring MD:  Date of Birth: 07-05-1990 Gender: Male Account #: [id number] Procedure:                Upper GI endoscopy Indications:              Lower abdominal pain, Melena, Diarrhea Medicines:                Monitored Anesthesia Care Procedure:                Pre-Anesthesia Assessment:                           - Prior to the procedure, a History and Physical                            was performed, and patient medications and                            allergies were reviewed. The patient's tolerance of                            previous anesthesia was also reviewed. The risks                            and benefits of the procedure and the sedation                            options and risks were discussed with the patient.                            All questions were answered, and informed consent                            was obtained. Prior Anticoagulants: The patient has                            taken no previous anticoagulant or antiplatelet                            agents. ASA Grade Assessment: II - A patient with                            mild systemic disease. After reviewing the risks                            and benefits, the patient was deemed in                            satisfactory condition to undergo the procedure.                           After obtaining informed consent, the endoscope was  passed under direct vision. Throughout the                            procedure, the patient's blood pressure, pulse, and                            oxygen saturations were monitored continuously. The                            Endoscope was introduced through the mouth, and                            advanced to the second part of duodenum. The upper                            GI endoscopy was  accomplished without difficulty.                            The patient tolerated the procedure well. Scope In: Scope Out: Findings:                 The Z-line was regular and was found 40 cm from the                            incisors.                           Mucosal changes including feline appearance and                            longitudinal furrows were found in the middle third                            of the esophagus and in the lower third of the                            esophagus. Biopsies were obtained from the proximal                            and distal esophagus with cold forceps for                            histology of suspected eosinophilic esophagitis.                            Estimated blood loss was minimal.                           A 1 cm sliding type hiatal hernia was present.                           The gastroesophageal flap valve was visualized  endoscopically and classified as Hill Grade III                            (minimal fold, loose to endoscope, hiatal hernia                            likely).                           Localized mild inflammation characterized by                            erythema was found in the prepyloric region of the                            stomach. Biopsies were taken with a cold forceps                            for Helicobacter pylori testing. Estimated blood                            loss was minimal.                           Localized mildly erythematous mucosa without active                            bleeding was found in the duodenal bulb. Biopsies                            were taken with a cold forceps for histology.                            Estimated blood loss was minimal.                           The second portion of the duodenum was normal. Complications:            No immediate complications. Estimated Blood Loss:     Estimated blood loss was minimal. Impression:                - Z-line regular, 40 cm from the incisors.                           - Esophageal mucosal changes. Biopsied.                           - 1 cm hiatal hernia.                           - Gastroesophageal flap valve classified as Hill                            Grade III (minimal fold, loose to endoscope, hiatal  hernia likely).                           - Gastritis. Biopsied.                           - Erythematous duodenopathy. Biopsied.                           - Normal second portion of the duodenum. Recommendation:           - Patient has a contact number available for                            emergencies. The signs and symptoms of potential                            delayed complications were discussed with the                            patient. Return to normal activities tomorrow.                            Written discharge instructions were provided to the                            patient.                           - Resume previous diet.                           - Continue present medications.                           - Await pathology results.                           - Use Prilosec (omeprazole) 20 mg PO BID for 6                            weeks to promote mucosal healing.                           - Perform a colonoscopy today. Gerrit Heck, MD 05/10/2020 3:24:22 PM

## 2020-05-10 NOTE — Progress Notes (Signed)
Pt appeared to have bitten his lower lip during the procedure.  Ice pack applied.

## 2020-05-10 NOTE — Patient Instructions (Signed)
Handout given:  Polyps, Hemorrhoids Start omeprazole 20mg  twice daily at least 30 min before meals. Resume previous diet  Continue current medications Start taking supplemental fiber ie;  Metamucil, citrucel, fibercon daily Await pathology results  YOU HAD AN ENDOSCOPIC PROCEDURE TODAY AT [facility]:   Refer to the procedure report that was given to you for any specific questions about what was found during the examination.  If the procedure report does not answer your questions, please call your gastroenterologist to clarify.  If you requested that your care partner not be given the details of your procedure findings, then the procedure report has been included in a sealed envelope for you to review at your convenience later.  YOU SHOULD EXPECT: Some feelings of bloating in the abdomen. Passage of more gas than usual.  Walking can help get rid of the air that was put into your GI tract during the procedure and reduce the bloating. If you had a lower endoscopy (such as a colonoscopy or flexible sigmoidoscopy) you may notice spotting of blood in your stool or on the toilet paper. If you underwent a bowel prep for your procedure, you may not have a normal bowel movement for a few days.  Please Note:  You might notice some irritation and congestion in your nose or some drainage.  This is from the oxygen used during your procedure.  There is no need for concern and it should clear up in a day or so.  SYMPTOMS TO REPORT IMMEDIATELY:   Following lower endoscopy (colonoscopy or flexible sigmoidoscopy):  Excessive amounts of blood in the stool  Significant tenderness or worsening of abdominal pains  Swelling of the abdomen that is new, acute  Fever of 100F or higher   Following upper endoscopy (EGD)  Vomiting of blood or coffee ground material  New chest pain or pain under the shoulder blades  Painful or persistently difficult swallowing  New shortness of breath  Fever of 100F  or higher  Black, tarry-looking stools  For urgent or emergent issues, a gastroenterologist can be reached at any hour by calling [phone]. Do not use MyChart messaging for urgent concerns.   DIET:  We do recommend a small meal at first, but then you may proceed to your regular diet.  Drink plenty of fluids but you should avoid alcoholic beverages for 24 hours.  ACTIVITY:  You should plan to take it easy for the rest of today and you should NOT DRIVE or use heavy machinery until tomorrow (because of the sedation medicines used during the test).    FOLLOW UP: Our staff will call the number listed on your records 48-72 hours following your procedure to check on you and address any questions or concerns that you may have regarding the information given to you following your procedure. If we do not reach you, we will leave a message.  We will attempt to reach you two times.  During this call, we will ask if you have developed any symptoms of COVID 19. If you develop any symptoms (ie: fever, flu-like symptoms, shortness of breath, cough etc.) before then, please call [phone].  If you test positive for Covid 19 in the 2 weeks post procedure, please call and report this information to [place].    If any biopsies were taken you will be contacted by phone or by letter within the next 1-3 weeks.  Please call us at [phone] if you have not heard about the biopsies in  3 weeks.   SIGNATURES/CONFIDENTIALITY: You and/or your care partner have signed paperwork which will be entered into your electronic medical record.  These signatures attest to the fact that that the information above on your After Visit Summary has been reviewed and is understood.  Full responsibility of the confidentiality of this discharge information lies with you and/or your care-partner.

## 2020-05-10 NOTE — Op Note (Signed)
[facility] Patient Name: Derrick Wang Procedure Date: 05/10/2020 2:39 PM MRN: [id number] Endoscopist: Gerrit Heck , MD Age: 30 Referring MD:  Date of Birth: Nov 08, 1990 Gender: Male Account #: [id number] Procedure:                Colonoscopy Indications:              Lower abdominal pain, Hematochezia, Abnormal CT of                            the GI tract (colitis on CT in 08/2019), Change in                            bowel habits, Diarrhea                           Recent GI PCR panel with positive E coli, treated                            with Azithromycin Medicines:                Monitored Anesthesia Care Procedure:                Pre-Anesthesia Assessment:                           - Prior to the procedure, a History and Physical                            was performed, and patient medications and                            allergies were reviewed. The patient's tolerance of                            previous anesthesia was also reviewed. The risks                            and benefits of the procedure and the sedation                            options and risks were discussed with the patient.                            All questions were answered, and informed consent                            was obtained. Prior Anticoagulants: The patient has                            taken no previous anticoagulant or antiplatelet                            agents. ASA Grade Assessment: II - A patient with  mild systemic disease. After reviewing the risks                            and benefits, the patient was deemed in                            satisfactory condition to undergo the procedure.                           After obtaining informed consent, the colonoscope                            was passed under direct vision. Throughout the                            procedure, the patient's blood pressure, pulse, and                             oxygen saturations were monitored continuously. The                            Olympus CF-HQ190L ([id number]) Colonoscope was                            introduced through the anus and advanced to the the                            terminal ileum. The colonoscopy was performed                            without difficulty. The patient tolerated the                            procedure well. The quality of the bowel                            preparation was good. The terminal ileum, ileocecal                            valve, appendiceal orifice, and rectum were                            photographed. Scope In: 2:59:35 PM Scope Out: 3:11:19 PM Scope Withdrawal Time: 0 hours 9 minutes 28 seconds  Total Procedure Duration: 0 hours 11 minutes 44 seconds  Findings:                 Hemorrhoids were found on perianal exam.                           A 3 mm polyp was found in the distal rectum. The                            polyp was sessile. The polyp was removed with a  cold biopsy forceps. Resection and retrieval were                            complete. Estimated blood loss was minimal.                           Normal mucosa was found in the entire colon.                            Biopsies for histology were taken with a cold                            forceps from the right colon and left colon for                            evaluation of microscopic colitis. Estimated blood                            loss was minimal.                           Non-bleeding internal hemorrhoids were found during                            retroflexion. The hemorrhoids were small and Grade                            II (internal hemorrhoids that prolapse but reduce                            spontaneously).                           The terminal ileum appeared normal. Complications:            No immediate complications. Estimated Blood Loss:     Estimated blood loss was  minimal. Impression:               - Hemorrhoids found on perianal exam.                           - One 3 mm polyp in the distal rectum, removed with                            a cold biopsy forceps. Resected and retrieved.                           - Normal mucosa in the entire examined colon.                            Biopsied.                           - Non-bleeding internal hemorrhoids.                           -  The examined portion of the ileum was normal. Recommendation:           - Patient has a contact number available for                            emergencies. The signs and symptoms of potential                            delayed complications were discussed with the                            patient. Return to normal activities tomorrow.                            Written discharge instructions were provided to the                            patient.                           - Resume previous diet.                           - Continue present medications.                           - Await pathology results.                           - Repeat colonoscopy for surveillance based on                            pathology results.                           - Use fiber, for example Citrucel, Fibercon, Konsyl                            or Metamucil.                           - Internal hemorrhoids were noted on this study and                            may be amenable to hemorrhoid band ligation. If you                            are interested in further treatment of these                            hemorrhoids with band ligation, please contact my                            clinic to set up an appointment for evaluation and  treatment. Gerrit Heck, MD 05/10/2020 3:28:51 PM

## 2020-05-10 NOTE — Progress Notes (Signed)
Pt's states no medical or surgical changes since previsit or office visit.  CW vitals and JD IV. 

## 2020-05-10 NOTE — Telephone Encounter (Signed)
-----   Message from Halbur, DO sent at 05/10/2020  3:39 PM EDT ----- Can you put in Rx for Bentyl 10 mg. Take 1 tab TID x2 weeks, then change to prn Q8 hours. #60, RF3. Thanks

## 2020-05-10 NOTE — Progress Notes (Signed)
pt tolerated well. VSS. awake and to recovery. Report given to RN. Bite block left insitu. °

## 2020-05-11 ENCOUNTER — Telehealth: Payer: Self-pay | Admitting: Gastroenterology

## 2020-05-11 DIAGNOSIS — R509 Fever, unspecified: Secondary | ICD-10-CM

## 2020-05-11 DIAGNOSIS — R059 Cough, unspecified: Secondary | ICD-10-CM

## 2020-05-11 DIAGNOSIS — R52 Pain, unspecified: Secondary | ICD-10-CM

## 2020-05-11 NOTE — Telephone Encounter (Signed)
Inbound call from patient. Patient had Endo/Colon yesterday 4/25. He states he have had a fever of >100 since 4am, having chills, shortness of breath, cough that is causing entire body to hurt, and black stool. He would like a call back [phone]

## 2020-05-11 NOTE — Telephone Encounter (Signed)
Spoke with patient in regards to recommendations. Patient is aware that he will need to go by the lab and x-ray department in the basement of our office building before 5 PM. He has been advised to continue supportive therapy at home, Tylenol as needed, staying hydrated with fluids. Patient has been advised that he will need to go to the ER for evaluation if his symptoms persist or worsen. Patient verbalized understanding of all information and had no concerns at the end of the call.   Lab order and x-ray order in epic.

## 2020-05-11 NOTE — Addendum Note (Signed)
Addended by: Yevette Edwards on: 05/11/2020 03:31 PM   Modules accepted: Orders

## 2020-05-11 NOTE — Telephone Encounter (Signed)
Called the patient back and he still is having the cough and congestion, chills and fever. Dr. Bryan Lemma ordered a cxay and some labs. This was relayed to the patient as well as continuing supportive therapy at home, pushing fluids, taking tylenol for fever and resting. It these symptoms persist or worsen he was encouraged to go to the ER. Delila Spence placed the order s for the patient.

## 2020-05-11 NOTE — Telephone Encounter (Signed)
Pt post EndoColon from yesterday.He woke up at 4 am with chills, body aches  and a cough. Fever 100.4. He had one BM with dark tarry stool. Will make Dr. Bryan Lemma aware.

## 2020-05-12 ENCOUNTER — Telehealth: Payer: Self-pay | Admitting: *Deleted

## 2020-05-12 NOTE — Telephone Encounter (Signed)
  Follow up Call-  Call back number 05/10/2020  Post procedure Call Back phone  # [phone]  Permission to leave phone message Yes  Some recent data might be hidden     Patient questions:  Do you have a fever, pain , or abdominal swelling? No. Pain Score  0 *  Have you tolerated food without any problems? No.  Have you been able to return to your normal activities? Yes.    Do you have any questions about your discharge instructions: Diet   No. Medications  No. Follow up visit  No.  Do you have questions or concerns about your Care? Yes.   Yesterday patient had a fever of 100.4 on and off but that has subsided with Tylenol. He is still with a cough but the chills are gone. Today patient reports that he has had 2 bowel movements with BRB this morning. Will let Dr. Bryan Lemma know.  Actions: * If pain score is 4 or above: No action needed, pain <4.  1. Have you developed a fever since your procedure yes  2.   Have you had an respiratory symptoms (SOB or cough) since your procedure? cough  3.   Have you tested positive for COVID 19 since your procedure no  4.   Have you had any family members/close contacts diagnosed with the COVID 19 since your procedure?  no   If yes to any of these questions please route to Joylene John, RN and Joella Prince, RN

## 2020-05-12 NOTE — Telephone Encounter (Signed)
Spoke with the patient regarding the bright red blood in his stool and the recommendations per Dr. Bryan Lemma for conservative measurements for the bleeding most likely from his hemorrhoids found on the colonscopy. Recommendations included rest, sitz bath and preparation H. IF this continues he needs to make a f/u appt for hemorrhoid banding. Pt verbalized understanding.

## 2020-05-12 NOTE — Telephone Encounter (Signed)
  Follow up Call-  Call back number 05/10/2020  Post procedure Call Back phone  # [phone]  Permission to leave phone message Yes  Some recent data might be hidden     First attempt for follow up phone call. No answer at number given. Unable to leave a message d/t full vm box.

## 2020-05-27 ENCOUNTER — Telehealth: Payer: Self-pay | Admitting: General Surgery

## 2020-05-27 NOTE — Telephone Encounter (Signed)
-----   Message from Soham, DO sent at 05/27/2020  1:06 PM EDT ----- The biopsies from the recent upper endoscopy and colonoscopy are as follows:  Upper Endoscopy: -The biopsies from the small intestine demonstrate peptic duodenitis (inflammation) but no evidence of helical back to pylori infection or Celiac Disease. -The biopsies from the stomach were benign.  There is no evidence of Helicobacter pylori infection. -The biopsies taken from the esophagus demonstrate increased lymphocytes (inflammatory cells) and a small amount of increased eosinophils (also inflammatory cells).  The number of eosinophils did not meet criteria for Eosinophilic Esophagitis.  Colonoscopy: -The polyp removed from the colon was a Tubular Adenoma.  This is considered benign, but precancerous.  This is removed entirely at the time of colonoscopy. 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  This is a chronic inflammatory condition that is typically due to medications but can also be associated with other autoimmune diseases (i.e. diabetes, thyroid disorders, celiac disease, etc.).  Treatment of this condition should be as follows  -Review for associated medications, most commonly NSAIDs, PPIs, ranitidine, statins, SSRIs. -If currently smoking, strongly recommend smoking cessation -Start with loperamide for mild diarrhea (<3 stools daily).  If nocturnal symptoms are worst, start his nightly. -In patients with >3 stools daily, or no improvement with loperamide, start budesonide 9 mg daily for 6 to 8 weeks then taper to 6 mg x 2 weeks, 3 mg x 2 weeks, discontinue.  If symptoms persist or recur during tapering, continue budesonide 9 mg x 12 weeks then slowly taper.

## 2020-05-27 NOTE — Telephone Encounter (Signed)
Spoke with the patient in depth about his ECL- the patient stated he was taking his omeprazole and bentyl at the same time and had been getting nauseated. He was uncertain as to which one was making him nauseated so he decided to discontinue them both.   We discussed his past reflux symptoms and they are now gone. He is still having loose stools daily and will start with loperamide and see how that helps. If no change he will call back in a few days to advise me.

## 2020-06-29 ENCOUNTER — Encounter: Payer: Self-pay | Admitting: *Deleted

## 2020-06-29 NOTE — Progress Notes (Addendum)
Cardiology Office Note   Date:  07/01/2020   ID:  Derrick Wang, DOB [birth year], MRN [id number]  PCP:  Joyice Faster, FNP  Cardiologist:   Minus Breeding, MD Referring:  Joyice Faster, FNP  Chief Complaint  Patient presents with   Congenital heart disease       History of Present Illness: Derrick Wang is a 30 y.o. 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  He has been followed for years with his last echo being in our system however as he aged out of the pediatric follow-up.  He had been suggested to follow-up with an adult cardiologist.  He was seen at [facility] in 2016 with some atypical chest pain.  I was able to review these results.  At that time he had moderate TR.  Right ventricular dysfunction was described as mildly reduced.  There is no mention of other defect in particular no ventricular septal defect or ASD.  The echocardiogram done in our system in April demonstrates the RV is a systemic ventricle.  Right ventricular function was moderately reduced.  Tricuspid regurgitation was recorded as trivial.  There was no description of pulmonary hypertension.  The patient recall having a cardiac MRI but thinks it was about 6 or 7 years ago.  I cannot find evidence of this.  He actually has no acute cardiac complaints.  He is told by his wife that he snores and looks like he stops breathing.  He has not had presyncope or syncope.  He is not having any chest pressure, neck or arm discomfort.  He does report a lot of stress.  He works in [employer].  He has a new 35-month-old.  He is building a house.  He has a company.  He is trying to locate his cardiac care closer to home and has not wanted to travel to [facility] anymore.   Past Medical History:  Diagnosis Date   Heart murmur    Rhabdomyolysis     Past Surgical History:  Procedure Laterality Date   left arm surgery       Current Outpatient  Medications  Medication Sig Dispense Refill   amLODipine (NORVASC) 2.5 MG tablet Take 1 tablet (2.5 mg total) by mouth daily. 180 tablet 3   No current facility-administered medications for this visit.    Allergies:   Patient has no known allergies.    Social History:  The patient  reports that he has never smoked. His smokeless tobacco use includes snuff. He reports current alcohol use of about 2.0 standard drinks of alcohol per week. He reports that he does not use drugs.   Family History:  The patient's family history is noncontributory  ROS:  Please see the history of present illness.   Otherwise, review of systems are positive for on this patient yesterday.  She went and had the lab done this morning and axis 0 0.86 and a D-dimer and I have today she did not have any on her back so he presented to the ER at that she had a question can.   All other systems are reviewed and negative.    PHYSICAL EXAM: VS:  BP (!) 174/112   Pulse 75   Ht 5\' 8"  (1.727 m)   Wt 252 lb 6.4 oz (114.5 kg)   SpO2 98%   BMI 38.38 kg/m  , BMI Body mass index is 38.38 kg/m. GENERAL:  Well appearing HEENT:  Pupils  equal round and reactive, fundi not visualized, oral mucosa unremarkable NECK:  No jugular venous distention, waveform within normal limits, carotid upstroke brisk and symmetric, no bruits, no thyromegaly LYMPHATICS:  No cervical, inguinal adenopathy LUNGS:  Clear to auscultation bilaterally BACK:  No CVA tenderness CHEST:  Unremarkable HEART:  PMI not displaced or sustained,S1 and S2 within normal limits, no S3, no S4, no clicks, no rubs, 3 systolic murmur at the left lower sternal border murmurs ABD:  Flat, positive bowel sounds normal in frequency in pitch, no bruits, no rebound, no guarding, no midline pulsatile mass, no hepatomegaly, no splenomegaly EXT:  2 plus pulses throughout, no edema, no cyanosis no clubbing SKIN:  No rashes no nodules NEURO:  Cranial nerves II through XII grossly  intact, motor grossly intact throughout PSYCH:  Cognitively intact, oriented to person place and time    EKG:  EKG is ordered today. The ekg ordered today demonstrates sinus rhythm, first-degree AV block, interventricular conduction block, no old EKGs for comparison.   Recent Labs: 05/03/2020: ALT 23; BUN 18; Creatinine, Ser 1.06; Hemoglobin 15.9; Platelets 309.0; Potassium 4.3; Sodium 138    Lipid Panel No results found for: CHOL, TRIG, HDL, CHOLHDL, VLDL, LDLCALC, LDLDIRECT    Wt Readings from Last 3 Encounters:  06/30/20 252 lb 6.4 oz (114.5 kg)  05/10/20 253 lb (114.8 kg)  05/03/20 253 lb (114.8 kg)      Other studies Reviewed: Additional studies/ records that were reviewed today include: Echocardiogram extensive review of records from [facility]. Review of the above records demonstrates:  Please see elsewhere in the note.     ASSESSMENT AND PLAN:  L TGA:  He needs an MRI to follow his RV function.  He also need a Holter to follow up and make sure there is no evidence of significant bradycardia arrhythmia.  He really wants to keep this care local and I did agree that I would do the MRI and the Holter but I very much suggested that his follow-up should be with Dr. Jeralyn Bennett.  I told him that at some point is likely he might need more advanced therapies including pacemaker or other and that I would prefer he have this at [facility].  He would agree to having routine follow-up with the [facility] in [place].  Addendum: I spoke with Dr. Corine Shelter and I am going to cancel the MRI that I had scheduled as this is better done at [facility].  I spoke with the patient about this.  We will proceed with the other work-up and we will make a referral to [facility] for the patient's continued follow-up.  HTN: His blood pressure is elevated.  He is under significant stress.  We salt and fluid restriction.  He had previously been on amlodipine and I have restarted 2.5 mg.  He  is going to get a large blood pressure cuff and follow this.  He will let me know his blood pressure readings.  He certainly needs normalized blood pressures.  SNORING: He most definitely has sleep apnea and I will order a sleep study.   Current medicines are reviewed at length with the patient today.  The patient does not have concerns regarding medicines.  The following changes have been made: As above  Labs/ tests ordered today include:   Orders Placed This Encounter  Procedures   Basic metabolic panel   Ambulatory referral to Pulmonology   EKG 12-Lead     Disposition:   FU will  be arranged as above.      Signed, Minus Breeding, MD  07/01/2020 5:03 PM    [facility]

## 2020-06-30 ENCOUNTER — Encounter: Payer: Self-pay | Admitting: Cardiology

## 2020-06-30 ENCOUNTER — Ambulatory Visit (INDEPENDENT_AMBULATORY_CARE_PROVIDER_SITE_OTHER): Payer: BC Managed Care – PPO | Admitting: Cardiology

## 2020-06-30 ENCOUNTER — Encounter: Payer: Self-pay | Admitting: *Deleted

## 2020-06-30 ENCOUNTER — Ambulatory Visit: Payer: BC Managed Care – PPO

## 2020-06-30 ENCOUNTER — Other Ambulatory Visit: Payer: Self-pay | Admitting: Cardiology

## 2020-06-30 VITALS — BP 174/112 | HR 75 | Ht 68.0 in | Wt 252.4 lb

## 2020-06-30 DIAGNOSIS — Q205 Discordant atrioventricular connection: Secondary | ICD-10-CM

## 2020-06-30 DIAGNOSIS — R001 Bradycardia, unspecified: Secondary | ICD-10-CM

## 2020-06-30 DIAGNOSIS — R011 Cardiac murmur, unspecified: Secondary | ICD-10-CM

## 2020-06-30 DIAGNOSIS — G4730 Sleep apnea, unspecified: Secondary | ICD-10-CM | POA: Diagnosis not present

## 2020-06-30 MED ORDER — AMLODIPINE BESYLATE 2.5 MG PO TABS: 2.5000 mg | ORAL_TABLET | Freq: Every day | ORAL | 3 refills | Status: AC

## 2020-06-30 NOTE — Patient Instructions (Addendum)
Medication Instructions:  Your physician has recommended you make the following change in your medication:  Start amodipine 2.5 mg by mouth daily Continue other medications the same  Labwork: Your physician recommends that you return for non-fasting lab work in: within 6 weeks of cardiac MRI. This can be done at [facility]  Testing/Procedures: Your physician has requested that you have a cardiac MRI. Cardiac MRI uses a computer to create images of your heart as its beating, producing both still and moving pictures of your heart and major blood vessels. For further information please visit [email or web address] Please follow the instruction sheet given to you today for more information. ZIO- Long Term Monitor Instructions   Your physician has requested you wear your ZIO patch monitor 3 days.   This is a single patch monitor.  [employer] supplies one patch monitor per enrollment.  Additional stickers are not available.   Please do not apply patch if you will be having a Nuclear Stress Test, Echocardiogram, Cardiac CT, MRI, or Chest Xray during the time frame you would be wearing the monitor. The patch cannot be worn during these tests.  You cannot remove and re-apply the ZIO XT patch monitor.   Your ZIO patch monitor will be sent USPS Priority mail from [facility] directly to your home address. The monitor may also be mailed to a PO BOX if home delivery is not available.   It may take 3-5 days to receive your monitor after you have been enrolled.   Once you have received you monitor, please review enclosed instructions.  Your monitor has already been registered assigning a specific monitor serial # to you.   Applying the monitor   Shave hair from upper left chest.   Hold abrader disc by orange tab.  Rub abrader in 40 strokes over left upper chest as indicated in your monitor instructions.   Clean area with 4 enclosed alcohol pads .  Use all pads to assure are is cleaned  thoroughly.  Let dry.   Apply patch as indicated in monitor instructions.  Patch will be place under collarbone on left side of chest with arrow pointing upward.   Rub patch adhesive wings for 2 minutes.Remove white label marked "1".  Remove white label marked "2".  Rub patch adhesive wings for 2 additional minutes.   While looking in a mirror, press and release button in center of patch.  A small green light will flash 3-4 times .  This will be your only indicator the monitor has been turned on.     Do not shower for the first 24 hours.  You may shower after the first 24 hours.   Press button if you feel a symptom. You will hear a small click.  Record Date, Time and Symptom in the Patient Log Book.   When you are ready to remove patch, follow instructions on last 2 pages of Patient Log Book.  Stick patch monitor onto last page of Patient Log Book.   Place Patient Log Book in [place] box.  Use locking tab on box and tape box closed securely.  The [employer] and [employer] has [employer] on it.  Please place in mailbox as soon as possible.  Your physician should have your test results approximately 7 days after the monitor has been mailed back to [facility].   Call [place] at [phone] if you have questions regarding your ZIO XT patch monitor.  Call them immediately if you see an  orange light blinking on your monitor.   If your monitor falls off in less than 4 days contact our Monitor department at [phone].  If your monitor becomes loose or falls off after 4 days call [employer] at [phone] for suggestions on securing your monitor.  Follow-Up: Your physician recommends that you schedule a follow-up appointment in: 1 year. You will receive a reminder call or letter in the mail in about months reminding you to call and schedule your appointment. If you don't receive this letter, please contact our office.  Any Other Special Instructions Will Be Listed Below (If  Applicable). You have been referred to have a sleep study  If you need a refill on your cardiac medications before your next appointment, please call your pharmacy.

## 2020-07-01 ENCOUNTER — Telehealth: Payer: Self-pay | Admitting: *Deleted

## 2020-07-01 DIAGNOSIS — Q205 Discordant atrioventricular connection: Secondary | ICD-10-CM

## 2020-07-01 NOTE — Telephone Encounter (Signed)
Per Dr. Percival Spanish, cancel Cardiac MRI and refer to [facility] Dr Jeralyn Bennett. Patient aware.

## 2020-07-17 ENCOUNTER — Encounter (HOSPITAL_COMMUNITY): Payer: Self-pay

## 2020-07-17 ENCOUNTER — Emergency Department (HOSPITAL_COMMUNITY)
Admission: EM | Admit: 2020-07-17 | Discharge: 2020-07-17 | Disposition: A | Discharge status: Home / Self Care | Payer: No Typology Code available for payment source | Attending: Emergency Medicine | Admitting: Emergency Medicine

## 2020-07-17 ENCOUNTER — Other Ambulatory Visit: Payer: Self-pay

## 2020-07-17 DIAGNOSIS — S81852A Open bite, left lower leg, initial encounter: Secondary | ICD-10-CM | POA: Insufficient documentation

## 2020-07-17 DIAGNOSIS — Z23 Encounter for immunization: Secondary | ICD-10-CM | POA: Diagnosis not present

## 2020-07-17 DIAGNOSIS — S8992XA Unspecified injury of left lower leg, initial encounter: Secondary | ICD-10-CM | POA: Diagnosis present

## 2020-07-17 DIAGNOSIS — W540XXA Bitten by dog, initial encounter: Secondary | ICD-10-CM | POA: Diagnosis not present

## 2020-07-17 MED ORDER — AMOXICILLIN-POT CLAVULANATE 875-125 MG PO TABS: 1.0000 | ORAL_TABLET | Freq: Two times a day (BID) | ORAL | 0 refills | Status: AC

## 2020-07-17 MED ORDER — TETANUS-DIPHTH-ACELL PERTUSSIS 5-2.5-18.5 LF-MCG/0.5 IM SUSY
0.5000 mL | PREFILLED_SYRINGE | Freq: Once | INTRAMUSCULAR | Status: AC
  Administered 2020-07-17: 0.5 mL via INTRAMUSCULAR
  Filled 2020-07-17: qty 0.5

## 2020-07-17 NOTE — ED Triage Notes (Signed)
Pt here from work after getting bit by a stray pitbull on back of left leg. Here for [employer]
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
## 2020-07-17 NOTE — ED Notes (Signed)
ED Provider at bedside. 

## 2020-07-28 ENCOUNTER — Telehealth: Payer: Self-pay | Admitting: Cardiology

## 2020-07-28 NOTE — Telephone Encounter (Signed)
Spoke with patient in regards to a referral to Dr. Corine Shelter at [facility]. Patient states that he is scheduled to have an MRI on 09-01-2020 at [facility] [place] is contact person .  [phone].

## 2020-08-19 ENCOUNTER — Institutional Professional Consult (permissible substitution): Payer: BC Managed Care – PPO | Admitting: Pulmonary Disease

## 2020-10-12 ENCOUNTER — Institutional Professional Consult (permissible substitution): Payer: BC Managed Care – PPO | Admitting: Pulmonary Disease

## 2021-06-05 IMAGING — CT CT ABD-PELV W/ CM
2 of 4 series · 16 of 46 positions shown, 18 images · IV contrast (Omnipaque or Isovue)
Comparison: None

CLINICAL DATA: LEFT lower quadrant abdominal pain

EXAM:
CT ABDOMEN AND PELVIS WITH CONTRAST
TECHNIQUE: Multidetector CT imaging of the abdomen and pelvis was performed
using the standard protocol following bolus administration of
intravenous contrast.
CONTRAST:  100mL OMNIPAQUE IOHEXOL 300 MG/ML  SOLN

[Series 3: axial st · axial · 0.71mm/px · z∈[+682,+1142]mm · 13 of 101 slices shown, 15 images]
[im 5/101  soft-tissue]
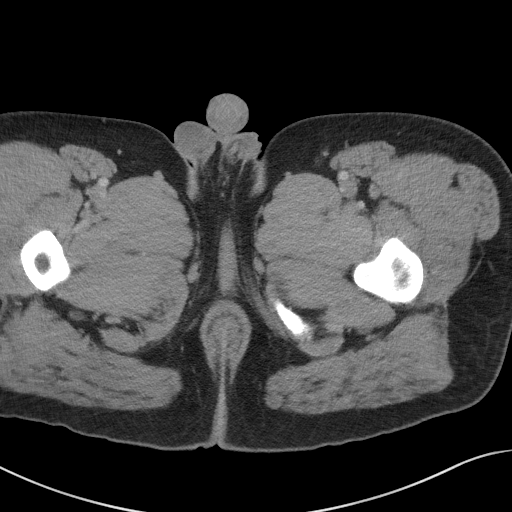
[im 5/101  bone]
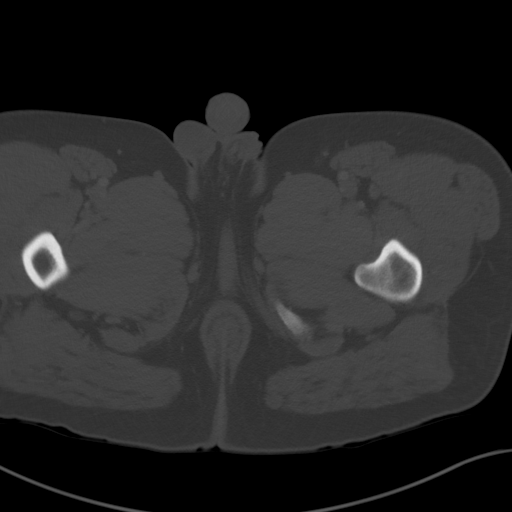
[im 13/101  soft-tissue]
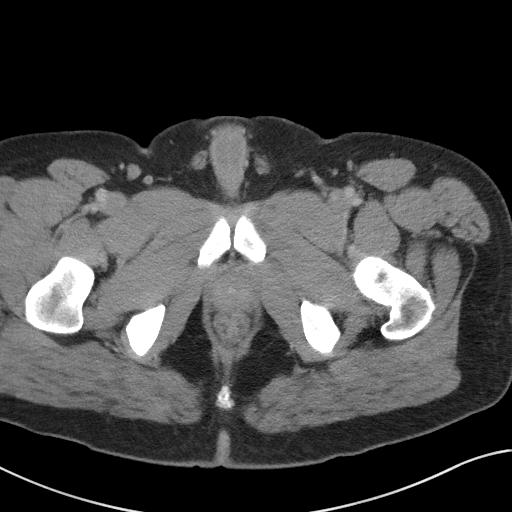
[im 21/101  soft-tissue]
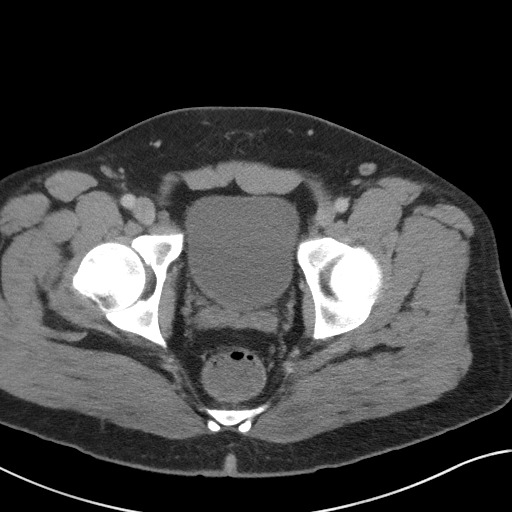
[im 29/101  soft-tissue]
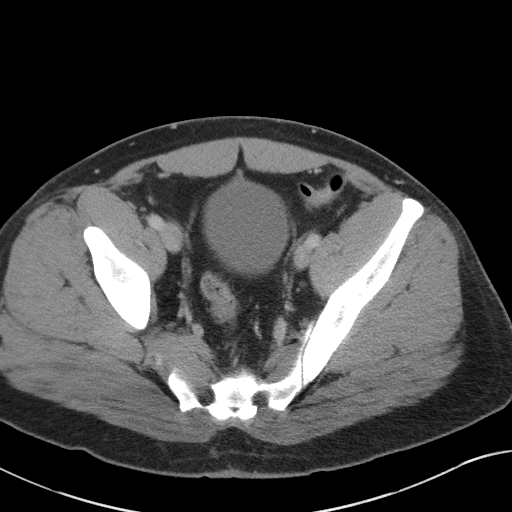
[im 37/101  soft-tissue]
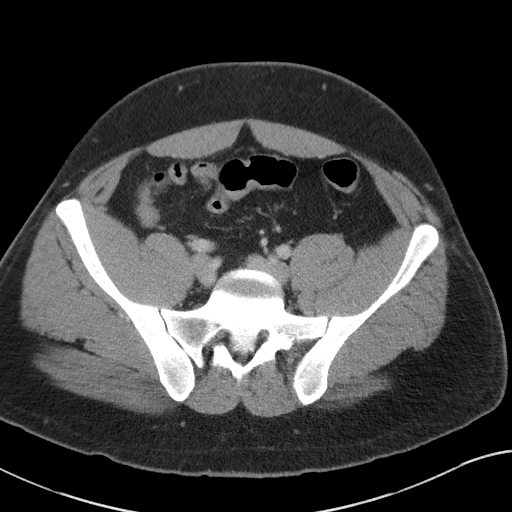
[im 45/101  soft-tissue]
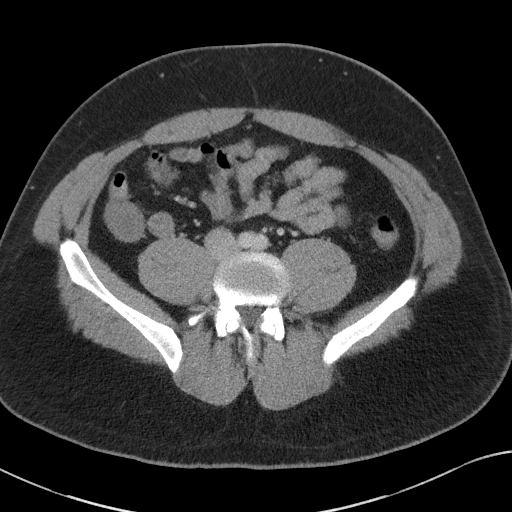
[im 53/101  soft-tissue]
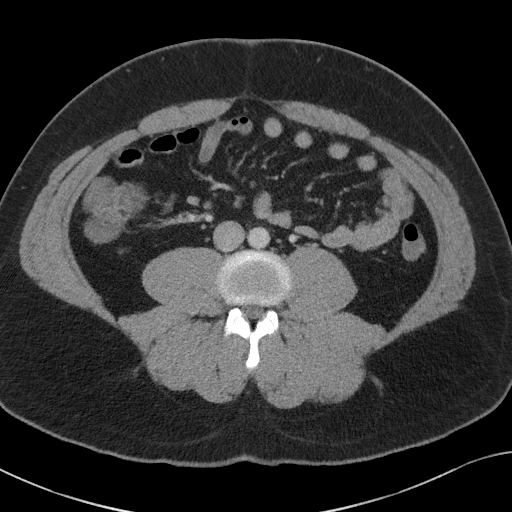
[im 57/101  soft-tissue]
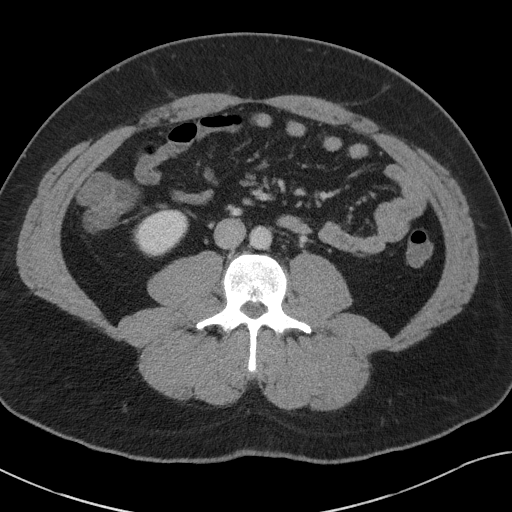
[im 65/101  soft-tissue]
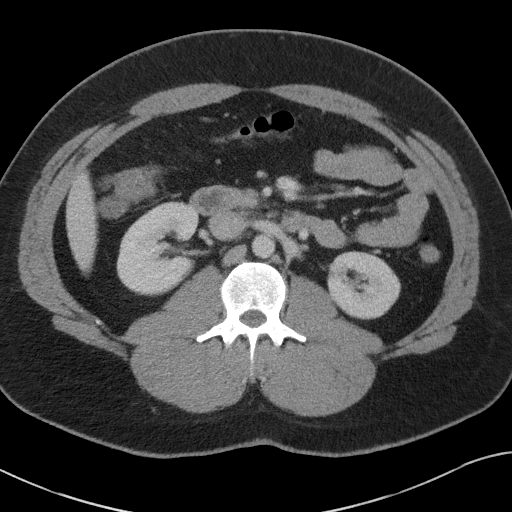
[im 65/101  bone]
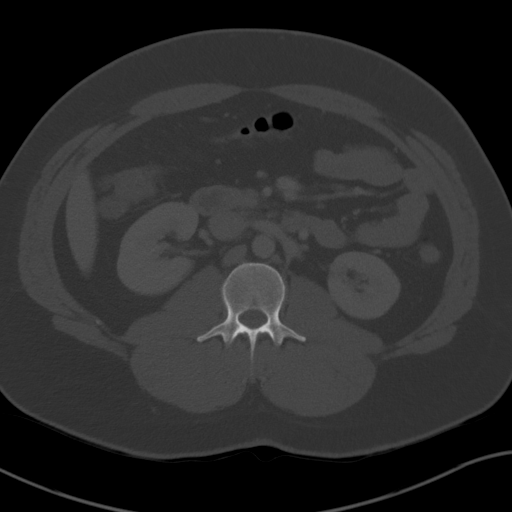
[im 73/101  soft-tissue]
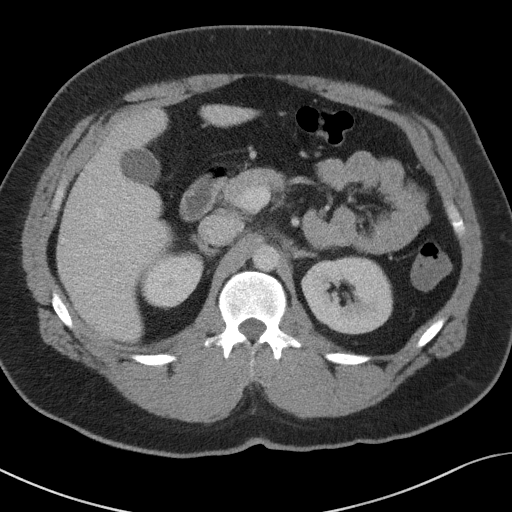
[im 81/101  soft-tissue]
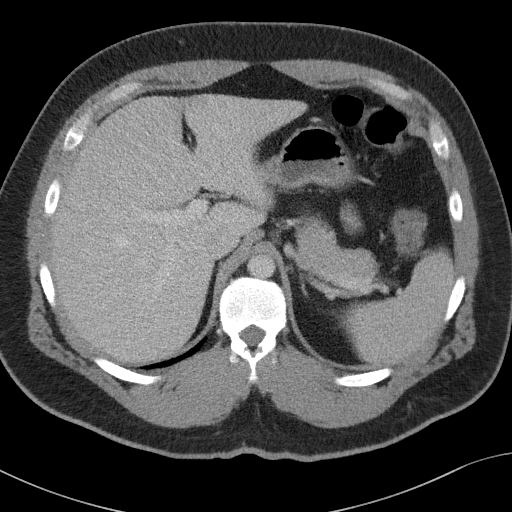
[im 89/101  soft-tissue]
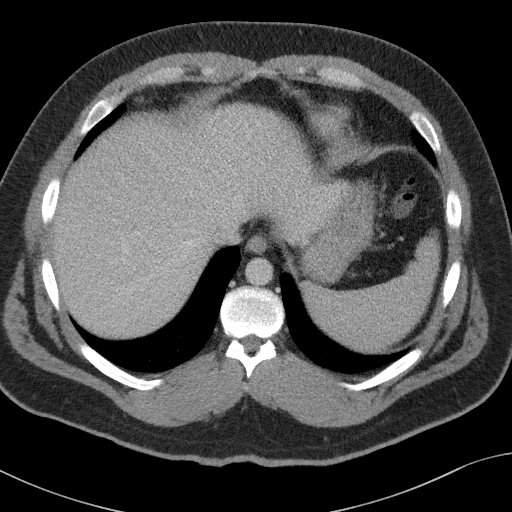
[im 97/101  soft-tissue]
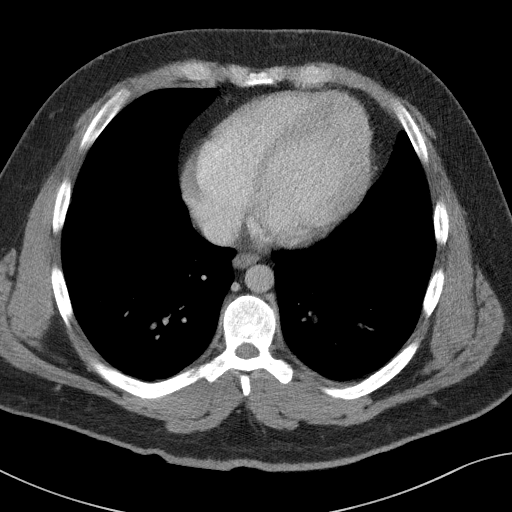

[Series 6: coronal st · coronal · 0.87mm/px · 3 of 116 slices shown]
[im 39/116  soft-tissue]
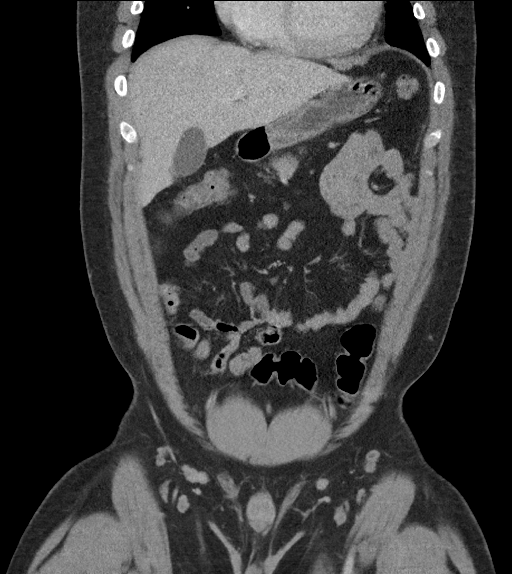
[im 52/116  soft-tissue]
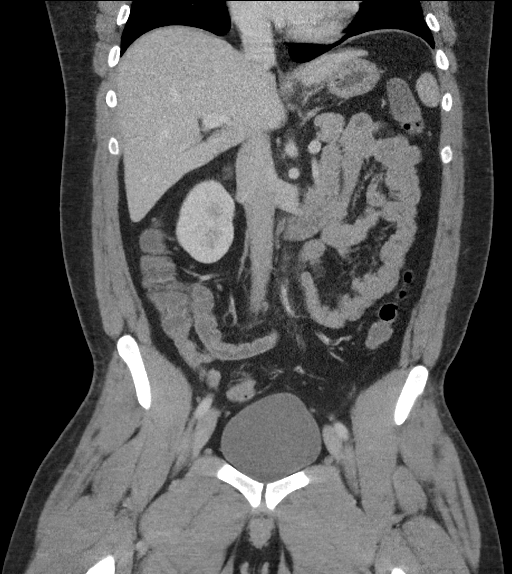
[im 64/116  soft-tissue]
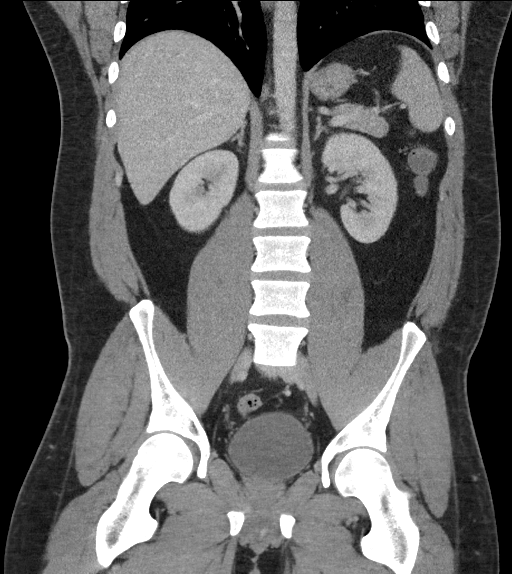

[16 of 46 positions shown; findings below may reference images not displayed]

FINDINGS: Lower chest: Lung bases are clear.

Hepatobiliary: No focal, suspicious hepatic lesion. No
pericholecystic stranding. No biliary duct distension. Portal vein
is patent.

Pancreas: Pancreas is normal.

Spleen: Spleen normal in size and contour without focal lesion.

Adrenals/Urinary Tract: Adrenal glands are normal.

No hydronephrosis.  No suspicious renal lesion.

Stomach/Bowel: Stomach under distended.  No small bowel dilation.

Appendix is normal.

Mid transverse colon with thickening. Generalized increased
vascularity about the colon. Ascending colon to a lesser extent with
some thickening. Appendix is normal.

Mild prominence of ileocolic lymph nodes.

Vascular/Lymphatic: No atheromatous plaque of the abdominal aorta.
No aneurysmal dilation. SMV is patent.

No adenopathy in the retroperitoneum.

No pelvic lymphadenopathy.

Reproductive: Prostate unremarkable by CT.

Other: Small fat containing umbilical hernia.

Musculoskeletal: No acute musculoskeletal process. No destructive
bone finding.
IMPRESSION: 1. Generalized increased vascularity about the colon. Findings
suggest generalized mild colitis perhaps worse in the ascending and
proximal transverse colon.
2. Mild prominence of ileocolic lymph nodes, likely reactive.
3. Small fat containing umbilical hernia.
4. Aortic atherosclerosis.

Aortic Atherosclerosis ([id number]-CI0.0).

## 2021-12-14 ENCOUNTER — Telehealth: Payer: Self-pay | Admitting: Cardiology

## 2021-12-14 NOTE — Telephone Encounter (Signed)
Pt stated he will be seeing a provider in [facility]

## 2023-07-12 ENCOUNTER — Other Ambulatory Visit: Payer: Self-pay

## 2023-07-12 ENCOUNTER — Encounter (HOSPITAL_COMMUNITY): Payer: Self-pay | Admitting: Emergency Medicine

## 2023-07-12 ENCOUNTER — Emergency Department (HOSPITAL_COMMUNITY)
Admission: EM | Admit: 2023-07-12 | Discharge: 2023-07-12 | Disposition: A | Discharge status: Home / Self Care | Payer: Worker's Compensation | Attending: Emergency Medicine | Admitting: Emergency Medicine

## 2023-07-12 ENCOUNTER — Emergency Department (HOSPITAL_COMMUNITY): Payer: Worker's Compensation

## 2023-07-12 DIAGNOSIS — M545 Low back pain, unspecified: Secondary | ICD-10-CM | POA: Diagnosis present

## 2023-07-12 MED ORDER — HYDROMORPHONE HCL 1 MG/ML IJ SOLN
1.0000 mg | Freq: Once | INTRAMUSCULAR | Status: AC
  Administered 2023-07-12: 1 mg via INTRAVENOUS
  Filled 2023-07-12: qty 1

## 2023-07-12 MED ORDER — ONDANSETRON HCL 4 MG/2ML IJ SOLN
4.0000 mg | Freq: Once | INTRAMUSCULAR | Status: AC
  Administered 2023-07-12: 4 mg via INTRAVENOUS
  Filled 2023-07-12: qty 2

## 2023-07-12 MED ORDER — NAPROXEN 500 MG PO TABS: 500.0000 mg | ORAL_TABLET | Freq: Two times a day (BID) | ORAL | 0 refills | Status: AC

## 2023-07-12 MED ORDER — METHOCARBAMOL 500 MG PO TABS: 500.0000 mg | ORAL_TABLET | Freq: Three times a day (TID) | ORAL | 0 refills | Status: AC | PRN
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
## 2023-07-12 NOTE — Discharge Instructions (Addendum)
 You were evaluated in the Emergency Department and after careful evaluation, we did not find any emergent condition requiring admission or further testing in the hospital.  Your exam/testing today is overall reassuring.  Symptoms may be due to muscle strain or bruising or possibly worsening of your bulging disc.  The CT scan did not show any broken bones.  Recommend using the Naprosyn  twice daily as prescribed for pain.  Can use the Robaxin muscle relaxer for more significant pain, best used at night if you are having trouble sleeping as it can cause drowsiness.  Would follow-up with the spine experts if not improved over the next 2 or 3 weeks.  Please return to the Emergency Department if you experience any worsening of your condition.   Thank you for allowing us  to be a part of your care.
